# Patient Record
Sex: Male | Born: 1980 | Race: Black or African American | Hispanic: No | Marital: Single | Smoking: Current every day smoker
Health system: Southern US, Community
[De-identification: ages and names within clinical notes are randomized; demographics above are authoritative.]

---

## 2004-07-17 ENCOUNTER — Emergency Department: Payer: Self-pay | Admitting: Emergency Medicine

## 2007-02-11 ENCOUNTER — Emergency Department: Payer: Self-pay | Admitting: Emergency Medicine

## 2007-09-13 ENCOUNTER — Emergency Department: Payer: Self-pay | Admitting: Emergency Medicine

## 2013-02-17 ENCOUNTER — Emergency Department: Payer: Self-pay | Admitting: Emergency Medicine

## 2013-02-17 LAB — CBC WITH DIFFERENTIAL/PLATELET
Basophil #: 0.1 10*3/uL (ref 0.0–0.1)
Basophil %: 1.2 %
Eosinophil #: 0.4 10*3/uL (ref 0.0–0.7)
Eosinophil %: 5.3 %
HCT: 43.8 % (ref 40.0–52.0)
MCH: 31.5 pg (ref 26.0–34.0)
MCHC: 33.9 g/dL (ref 32.0–36.0)
Monocyte %: 14.9 %
Neutrophil #: 3.2 10*3/uL (ref 1.4–6.5)
RDW: 12.7 % (ref 11.5–14.5)

## 2013-02-17 LAB — URINALYSIS, COMPLETE
Bilirubin,UR: NEGATIVE
Blood: NEGATIVE
Glucose,UR: NEGATIVE mg/dL (ref 0–75)
Ketone: NEGATIVE
Nitrite: NEGATIVE
Ph: 5 (ref 4.5–8.0)
Specific Gravity: 1.027 (ref 1.003–1.030)
Squamous Epithelial: 2

## 2013-02-17 LAB — COMPREHENSIVE METABOLIC PANEL
BUN: 13 mg/dL (ref 7–18)
Bilirubin,Total: 0.4 mg/dL (ref 0.2–1.0)
Co2: 30 mmol/L (ref 21–32)
Creatinine: 1.1 mg/dL (ref 0.60–1.30)
EGFR (African American): >60
Glucose: 91 mg/dL (ref 65–99)
Osmolality: 275 (ref 275–301)
SGOT(AST): 13 U/L — ABNORMAL LOW (ref 15–37)
Sodium: 138 mmol/L (ref 136–145)
Total Protein: 8.4 g/dL — ABNORMAL HIGH (ref 6.4–8.2)

## 2017-03-17 ENCOUNTER — Emergency Department: Payer: Self-pay

## 2017-03-17 ENCOUNTER — Other Ambulatory Visit: Payer: Self-pay

## 2017-03-17 ENCOUNTER — Emergency Department
Admission: EM | Admit: 2017-03-17 | Discharge: 2017-03-17 | Disposition: A | Discharge status: Home / Self Care | Payer: Self-pay | Attending: Emergency Medicine | Admitting: Emergency Medicine

## 2017-03-17 DIAGNOSIS — R0789 Other chest pain: Secondary | ICD-10-CM | POA: Insufficient documentation

## 2017-03-17 DIAGNOSIS — H538 Other visual disturbances: Secondary | ICD-10-CM | POA: Insufficient documentation

## 2017-03-17 DIAGNOSIS — R634 Abnormal weight loss: Secondary | ICD-10-CM | POA: Insufficient documentation

## 2017-03-17 DIAGNOSIS — R002 Palpitations: Secondary | ICD-10-CM | POA: Insufficient documentation

## 2017-03-17 LAB — BASIC METABOLIC PANEL
ANION GAP: 8 (ref 5–15)
BUN: 18 mg/dL (ref 6–20)
CALCIUM: 9.2 mg/dL (ref 8.9–10.3)
CO2: 28 mmol/L (ref 22–32)
Chloride: 100 mmol/L — ABNORMAL LOW (ref 101–111)
Creatinine, Ser: 1.1 mg/dL (ref 0.61–1.24)
Glucose, Bld: 92 mg/dL (ref 65–99)
Potassium: 3.8 mmol/L (ref 3.5–5.1)
Sodium: 136 mmol/L (ref 135–145)

## 2017-03-17 LAB — CBC
HCT: 45 % (ref 40.0–52.0)
HEMOGLOBIN: 15 g/dL (ref 13.0–18.0)
MCH: 31.4 pg (ref 26.0–34.0)
MCHC: 33.4 g/dL (ref 32.0–36.0)
MCV: 94.2 fL (ref 80.0–100.0)
Platelets: 259 10*3/uL (ref 150–440)
RBC: 4.78 MIL/uL (ref 4.40–5.90)
RDW: 12.9 % (ref 11.5–14.5)
WBC: 8.5 10*3/uL (ref 3.8–10.6)

## 2017-03-17 LAB — GLUCOSE, CAPILLARY: GLUCOSE-CAPILLARY: 80 mg/dL (ref 65–99)

## 2017-03-17 LAB — TSH: TSH: 1.9 u[IU]/mL (ref 0.350–4.500)

## 2017-03-17 LAB — TROPONIN I

## 2017-03-17 NOTE — ED Triage Notes (Signed)
Pt reports over the past month he has lost a lot of weight despite having a good appetite, pt states that over the past week his eyesight has been blurred and reports feeling chest pain and palpitations. Pt reports that he does donate plasma 3 times a month for the past year

## 2017-03-17 NOTE — ED Provider Notes (Signed)
[facility] Emergency Department Provider Note   ____________________________________________   First MD Initiated Contact with Patient 03/17/17 2304     (approximate)  I have reviewed the triage vital signs and the nursing notes.   HISTORY  Chief Complaint Chest Pain; Palpitations; and Weight Loss    HPI Joe Foster is a 37 y.o. male who presents to the ED from home with multiple medical complaints.  Firstly, patient reports unintentional weight loss over the past month of probably 20 pounds.  States that he eats a lot but his job at a [employer] is demanding.  Also notes that he donates plasma several times a month.  Initially he was donating up to 3 times a week; since the weight loss he is donating twice weekly.  Also, patient tells me he had to walk to work 8 miles each way for 2 weeks last month.  Secondly, patient reports chest pain with palpitations over the past week.  Describes tightness like a band wrapping both sides of his chest which resolves if he bears down or performs a Valsalva-like maneuver.  Has felt brief palpitations at work where he describes his heart rate is beating fast, not irregular.  These episodes are not associated with diaphoresis, shortness of breath, nausea/vomiting or dizziness.  Thirdly, patient has noted bilateral blurry vision over the past 1-2 weeks.  He does not wear corrective lenses and there has been no blunt or penetrating trauma.  States he can blink a few times in the blurry vision resolves.  Also mentions he has had polyuria and polydipsia.  Denies recent fever, chills, abdominal pain, diarrhea, bloody stools.  Denies recent travel, trauma or hormone use.   Past medical history None  There are no active problems to display for this patient.   History reviewed. No pertinent surgical history.  Prior to Admission medications   Not on File    Allergies Patient has no known allergies.  No family history  on file.  Social History Social History   Tobacco Use  . Smoking status: Not on file  Substance Use Topics  . Alcohol use: Not on file  . Drug use: Not on file  + smoker   Review of Systems  Constitutional: No fever/chills. Eyes: No visual changes. ENT: No sore throat. Cardiovascular: Positive for chest tightness and palpitations. Respiratory: Denies shortness of breath. Gastrointestinal: Positive for unintentional weight loss. No abdominal pain.  No nausea, no vomiting.  No diarrhea.  No constipation. Genitourinary: Positive for polyuria and polydipsia.  Negative for dysuria. Musculoskeletal: Negative for back pain. Skin: Negative for rash. Neurological: Negative for headaches, focal weakness or numbness.   ____________________________________________   PHYSICAL EXAM:  VITAL SIGNS: ED Triage Vitals  Enc Vitals Group     BP 03/17/17 2023 119/76     Pulse Rate 03/17/17 2023 77     Resp 03/17/17 2023 16     Temp 03/17/17 2023 97.7 F (36.5 C)     Temp Source 03/17/17 2023 Oral     SpO2 03/17/17 2023 99 %     Weight 03/17/17 2024 158 lb (71.7 kg)     Height 03/17/17 2024 6\' 2"  (1.88 m)     Head Circumference --      Peak Flow --      Pain Score 03/17/17 2034 4     Pain Loc --      Pain Edu? --      Excl. in GC? --  Constitutional: Alert and oriented. Well appearing and in no acute distress. Eyes: Conjunctivae are normal. PERRL. EOMI. Head: Atraumatic. Nose: No congestion/rhinnorhea. Mouth/Throat: Mucous membranes are moist.  Oropharynx non-erythematous. Neck: No stridor.   Cardiovascular: Normal rate, regular rhythm. Grossly normal heart sounds.  Good peripheral circulation. Respiratory: Normal respiratory effort.  No retractions. Lungs CTAB. Gastrointestinal: Thin. Soft and nontender. No distention. No abdominal bruits. No CVA tenderness. Musculoskeletal: No lower extremity tenderness nor edema.  No joint effusions. Neurologic:  Normal speech and  language. No gross focal neurologic deficits are appreciated. No gait instability. Skin:  Skin is warm, dry and intact. No rash noted. Psychiatric: Mood and affect are normal. Speech and behavior are normal.  ____________________________________________   LABS (all labs ordered are listed, but only abnormal results are displayed)  Labs Reviewed  BASIC METABOLIC PANEL - Abnormal; Notable for the following components:      Result Value   Chloride 100 (*)    All other components within normal limits  CBC  TROPONIN I  GLUCOSE, CAPILLARY  TSH  HEMOGLOBIN A1C   ____________________________________________  EKG  ED ECG REPORT I, Rebeccah Ivins J, the attending physician, personally viewed and interpreted this ECG.   Date: 03/17/2017  EKG Time: 2028  Rate: 96  Rhythm: normal EKG, normal sinus rhythm  Axis: RAD  Intervals:none  ST&T Change: Nonspecific  ____________________________________________  RADIOLOGY  Dg Chest 2 View  Result Date: 03/17/2017 CLINICAL DATA:  Pt reports over the past month he has lost a lot of weight despite having a good appetite, pt states that over the past week his eyesight has been blurred and reports feeling chest pain and palpitations. EXAM: CHEST  2 VIEW COMPARISON:  None. FINDINGS: The heart size and mediastinal contours are within normal limits. Both lungs are clear. The visualized skeletal structures are unremarkable. IMPRESSION: No active cardiopulmonary disease. Electronically Signed   By: Norva Pavlov M.D.   On: 03/17/2017 21:42    ____________________________________________   PROCEDURES  Procedure(s) performed: None  Procedures  Critical Care performed: No  ____________________________________________   INITIAL IMPRESSION / ASSESSMENT AND PLAN / ED COURSE  As part of my medical decision making, I reviewed the following data within the electronic MEDICAL RECORD NUMBER Nursing notes reviewed and incorporated, Labs reviewed, EKG  interpreted, Radiograph reviewed  and Notes from prior ED visits.   37 year old male who presents with unintentional weight loss, blurry vision, chest tightness and palpitations. Differential diagnosis includes, but is not limited to, ACS, aortic dissection, pulmonary embolism, cardiac tamponade, pneumothorax, pneumonia, pericarditis, myocarditis, GI-related causes including esophagitis/gastritis, metabolic etiology including diabetes, infectious etiology and musculoskeletal chest wall pain.    Lab work and chest x-ray unremarkable.  Patient is resting comfortably voicing no complaints currently.  Long discussion with patient; will add TSH, hemoglobin A1c. Sounds like part of patient's unintentional weight loss may be secondary to the fact that he had to walk 16 miles round trip to work for several weeks last month.  Symptoms of blurry vision, polydipsia and polyuria concerning for diabetes.  Although glucose is unremarkable on serum chemistries, will add hemoglobin A1c.  Will also refer patient to ophthalmology for eye exam.  Strict return precautions given.  Patient verbalizes understanding agrees with plan of care.      ____________________________________________   FINAL CLINICAL IMPRESSION(S) / ED DIAGNOSES  Final diagnoses:  Heart palpitations  Atypical chest pain  Blurry vision, bilateral  Weight loss     ED Discharge Orders    None  Note:  This document was prepared using Dragon voice recognition software and may include unintentional dictation errors.    Irean HongSung, Bereket Gernert J, MD 03/18/17 [phone]

## 2017-03-17 NOTE — ED Notes (Signed)
Reviewed discharge instructions and follow-up care with patient. Patient verbalized understanding of all information reviewed. Patient stable, with no distress noted at this time.    

## 2017-03-17 NOTE — ED Notes (Signed)
ED Provider at bedside. 

## 2017-03-17 NOTE — Discharge Instructions (Signed)
1.  You will be notified of any positive results from your thyroid and diabetes testing. 2.  Eat several small meals and drink plenty of fluids daily. 3.  Do not donate plasma more than twice a week. 4.  Return to the ER for worsening symptoms, persistent breathing or other concerns.

## 2017-03-17 NOTE — ED Notes (Signed)
Patient reports chronic, intermittent headaches with visual changes for approx 4 months.   Patient c/o left chest pain radiating to upper abdomen described as pressure. Patient reports SOB, nausea, and dizziness/lightheadedness with onset of chest pain; all symptoms with the exception of the chest pain have since resolved.

## 2017-03-18 LAB — HEMOGLOBIN A1C
HEMOGLOBIN A1C: 4.6 % — AB (ref 4.8–5.6)
Mean Plasma Glucose: 85.32 mg/dL

## 2018-03-07 ENCOUNTER — Other Ambulatory Visit: Payer: Self-pay

## 2018-03-07 ENCOUNTER — Encounter: Payer: Self-pay | Admitting: *Deleted

## 2018-03-07 DIAGNOSIS — K529 Noninfective gastroenteritis and colitis, unspecified: Secondary | ICD-10-CM | POA: Insufficient documentation

## 2018-03-07 DIAGNOSIS — F17200 Nicotine dependence, unspecified, uncomplicated: Secondary | ICD-10-CM | POA: Insufficient documentation

## 2018-03-07 DIAGNOSIS — J069 Acute upper respiratory infection, unspecified: Secondary | ICD-10-CM | POA: Insufficient documentation

## 2018-03-07 NOTE — ED Triage Notes (Signed)
Vomiting and diarrhea for about 4-5 days. Also has had a cough with productive yellow sputum.

## 2018-03-08 ENCOUNTER — Emergency Department: Payer: Self-pay

## 2018-03-08 ENCOUNTER — Emergency Department
Admission: EM | Admit: 2018-03-08 | Discharge: 2018-03-08 | Disposition: A | Discharge status: Home / Self Care | Payer: Self-pay | Attending: Emergency Medicine | Admitting: Emergency Medicine

## 2018-03-08 DIAGNOSIS — J069 Acute upper respiratory infection, unspecified: Secondary | ICD-10-CM

## 2018-03-08 DIAGNOSIS — K529 Noninfective gastroenteritis and colitis, unspecified: Secondary | ICD-10-CM

## 2018-03-08 LAB — INFLUENZA PANEL BY PCR (TYPE A & B)
Influenza A By PCR: NEGATIVE
Influenza B By PCR: NEGATIVE

## 2018-03-08 LAB — CBC
HCT: 39.7 % (ref 39.0–52.0)
Hemoglobin: 13.1 g/dL (ref 13.0–17.0)
MCH: 30.8 pg (ref 26.0–34.0)
MCHC: 33 g/dL (ref 30.0–36.0)
MCV: 93.4 fL (ref 80.0–100.0)
PLATELETS: 363 10*3/uL (ref 150–400)
RBC: 4.25 MIL/uL (ref 4.22–5.81)
RDW: 11.9 % (ref 11.5–15.5)
WBC: 9.2 10*3/uL (ref 4.0–10.5)
nRBC: 0 % (ref 0.0–0.2)

## 2018-03-08 LAB — COMPREHENSIVE METABOLIC PANEL
ALBUMIN: 3.8 g/dL (ref 3.5–5.0)
ALK PHOS: 48 U/L (ref 38–126)
ALT: 32 U/L (ref 0–44)
ANION GAP: 8 (ref 5–15)
AST: 26 U/L (ref 15–41)
BILIRUBIN TOTAL: 0.5 mg/dL (ref 0.3–1.2)
BUN: 12 mg/dL (ref 6–20)
CALCIUM: 8.8 mg/dL — AB (ref 8.9–10.3)
CO2: 25 mmol/L (ref 22–32)
Chloride: 106 mmol/L (ref 98–111)
Creatinine, Ser: 1.02 mg/dL (ref 0.61–1.24)
GFR calc Af Amer: >60 mL/min (ref >60)
GLUCOSE: 99 mg/dL (ref 70–99)
POTASSIUM: 3.4 mmol/L — AB (ref 3.5–5.1)
Sodium: 139 mmol/L (ref 135–145)
TOTAL PROTEIN: 6.8 g/dL (ref 6.5–8.1)

## 2018-03-08 LAB — URINALYSIS, COMPLETE (UACMP) WITH MICROSCOPIC
Bilirubin Urine: NEGATIVE
GLUCOSE, UA: NEGATIVE mg/dL
HGB URINE DIPSTICK: NEGATIVE
Ketones, ur: 20 mg/dL — AB
NITRITE: NEGATIVE
PH: 6 (ref 5.0–8.0)
Protein, ur: 30 mg/dL — AB
Specific Gravity, Urine: 1.025 (ref 1.005–1.030)

## 2018-03-08 LAB — LIPASE, BLOOD: Lipase: 26 U/L (ref 11–51)

## 2018-03-08 MED ORDER — SODIUM CHLORIDE 0.9 % IV BOLUS
1000.0000 mL | Freq: Once | INTRAVENOUS | Status: AC
  Administered 2018-03-08: 1000 mL via INTRAVENOUS

## 2018-03-08 MED ORDER — BENZONATATE 100 MG PO CAPS: 100.0000 mg | ORAL_CAPSULE | Freq: Three times a day (TID) | ORAL | 0 refills | Status: AC | PRN

## 2018-03-08 MED ORDER — ONDANSETRON HCL 4 MG/2ML IJ SOLN
4.0000 mg | Freq: Once | INTRAMUSCULAR | Status: AC
  Administered 2018-03-08: 4 mg via INTRAVENOUS
  Filled 2018-03-08: qty 2

## 2018-03-08 MED ORDER — POTASSIUM CHLORIDE CRYS ER 20 MEQ PO TBCR
40.0000 meq | EXTENDED_RELEASE_TABLET | Freq: Once | ORAL | Status: AC
  Administered 2018-03-08: 40 meq via ORAL
  Filled 2018-03-08: qty 2

## 2018-03-08 MED ORDER — ONDANSETRON 4 MG PO TBDP: 4.0000 mg | ORAL_TABLET | Freq: Three times a day (TID) | ORAL | 0 refills | Status: AC | PRN

## 2018-03-08 NOTE — ED Notes (Signed)
Assessment: pt states 4-5 days of vomiting and diarrhea. Pt states he initially had a fever with dry cough. Pt states fever has abated, but he is not able to "keep anything down". Pt states last diarrhea yesterday am.

## 2018-03-08 NOTE — ED Notes (Signed)
Report to rachel, rn.  

## 2018-03-08 NOTE — ED Notes (Signed)
Peripheral IV discontinued. Catheter intact. No signs of infiltration or redness. Gauze applied to IV site.   Discharge instructions reviewed with patient. Questions fielded by this RN. Patient verbalizes understanding of instructions. Patient discharged home in stable condition per brown. No acute distress noted at time of discharge.    

## 2018-03-08 NOTE — ED Provider Notes (Signed)
[facility] Emergency Department Provider Note    First MD Initiated Contact with Patient 03/08/18 [phone]     (approximate)  I have reviewed the triage vital signs and the nursing notes.   HISTORY  Chief Complaint Emesis    HPI Joe Foster is a 37 y.o. male presents to the emergency department a 5-day history of productive cough with yellow sputum vomiting and diarrhea.  Patient admits to subjective fevers.  Patient afebrile on presentation temperature 98.2.  Patient did not receive a flu vaccine this year.   Past medical history No chronic medical conditions. There are no active problems to display for this patient.  Surgical history None  Prior to Admission medications   Not on File    Allergies Patient has no known allergies.  No family history on file.  Social History Social History   Tobacco Use  . Smoking status: Current Every Day Smoker  Substance Use Topics  . Alcohol use: Yes  . Drug use: Yes    Types: Marijuana    Review of Systems Constitutional: No fever/chills Eyes: No visual changes. ENT: No sore throat. Cardiovascular: Denies chest pain.  Positive for productive cough Respiratory: Denies shortness of breath. Gastrointestinal: No abdominal pain.  Positive for vomiting and diarrhea genitourinary: Negative for dysuria. Musculoskeletal: Negative for neck pain.  Negative for back pain. Integumentary: Negative for rash. Neurological: Negative for headaches, focal weakness or numbness.   ____________________________________________   PHYSICAL EXAM:  VITAL SIGNS: ED Triage Vitals  Enc Vitals Group     BP 03/07/18 2350 (!) 144/94     Pulse Rate 03/07/18 2350 88     Resp 03/07/18 2350 18     Temp 03/07/18 2350 98.2 F (36.8 C)     Temp Source 03/07/18 2350 Oral     SpO2 03/07/18 2350 100 %     Weight --      Height --      Head Circumference --      Peak Flow --      Pain Score 03/07/18 2353 5     Pain Loc  --      Pain Edu? --      Excl. in GC? --     Constitutional: Alert and oriented. Well appearing and in no acute distress. Eyes: Conjunctivae are normal. Mouth/Throat: Mucous membranes are moist.  Oropharynx non-erythematous. Neck: No stridor.   Cardiovascular: Normal rate, regular rhythm. Good peripheral circulation. Grossly normal heart sounds. Respiratory: Normal respiratory effort.  No retractions. Lungs CTAB. Gastrointestinal: Soft and nontender. No distention.  Musculoskeletal: No lower extremity tenderness nor edema. No gross deformities of extremities. Neurologic:  Normal speech and language. No gross focal neurologic deficits are appreciated.  Skin:  Skin is warm, dry and intact. No rash noted. Psychiatric: Mood and affect are normal. Speech and behavior are normal. ____________________________________________   LABS (all labs ordered are listed, but only abnormal results are displayed)  Labs Reviewed  COMPREHENSIVE METABOLIC PANEL - Abnormal; Notable for the following components:      Result Value   Potassium 3.4 (*)    Calcium 8.8 (*)    All other components within normal limits  URINALYSIS, COMPLETE (UACMP) WITH MICROSCOPIC - Abnormal; Notable for the following components:   Color, Urine YELLOW (*)    APPearance CLEAR (*)    Ketones, ur 20 (*)    Protein, ur 30 (*)    Leukocytes, UA TRACE (*)    Bacteria, UA RARE (*)  All other components within normal limits  GASTROINTESTINAL PANEL BY PCR, STOOL (REPLACES STOOL CULTURE)  LIPASE, BLOOD  CBC  INFLUENZA PANEL BY PCR (TYPE A & B)   _________________________________  RADIOLOGY I, [place] Kenedi Cilia, personally viewed and evaluated these images (plain radiographs) as part of my medical decision making, as well as reviewing the written report by the radiologist.  ED MD interpretation: No active cardiopulmonary disease on chest x-ray per radiologist  Official radiology report(s): Dg Chest 2 View  Result Date:  03/08/2018 CLINICAL DATA:  Vomiting and diarrhea for 5 days. EXAM: CHEST - 2 VIEW COMPARISON:  March 17, 2017 FINDINGS: The heart size and mediastinal contours are within normal limits. Both lungs are clear. The visualized skeletal structures are unremarkable. IMPRESSION: No active cardiopulmonary disease. Electronically Signed   By: Sherian ReinWei-Chen  Lin M.D.   On: 03/08/2018 02:22    Procedures   ____________________________________________   INITIAL IMPRESSION / ASSESSMENT AND PLAN / ED COURSE  As part of my medical decision making, I reviewed the following data within the electronic MEDICAL RECORD NUMBER   37 year old male presenting with above-stated history and physical exam secondary to vomiting diarrhea and productive cough.  Considered possibly of pneumonia and as such chest x-ray was performed which was negative also considered possibility of influenza which was also negative.  Regarding the patient's vomiting and diarrhea consider the possibility of infectious diarrhea and as such stool sample ordered however patient had no further bowel movements while in the emergency department.  Patient given 2 L IV normal saline potassium 40 mEq.  I suspect viral etiology for the patient's symptoms and as such patient prescribed Tessalon Perles and Zofran ODT for home with recommendation oral hydration to continue at home ____________________________________________  FINAL CLINICAL IMPRESSION(S) / ED DIAGNOSES  Final diagnoses:  Gastroenteritis  Upper respiratory tract infection, unspecified type     MEDICATIONS GIVEN DURING THIS VISIT:  Medications  potassium chloride SA (K-DUR,KLOR-CON) CR tablet 40 mEq (has no administration in time range)  sodium chloride 0.9 % bolus 1,000 mL (1,000 mLs Intravenous New Bag/Given 03/08/18 0219)  sodium chloride 0.9 % bolus 1,000 mL (1,000 mLs Intravenous New Bag/Given 03/08/18 0219)  ondansetron (ZOFRAN) injection 4 mg (4 mg Intravenous Given 03/08/18 0310)      ED Discharge Orders    None       Note:  This document was prepared using Dragon voice recognition software and may include unintentional dictation errors.    Darci CurrentBrown, [place] N, MD 03/08/18 2220

## 2018-04-28 ENCOUNTER — Other Ambulatory Visit: Payer: Self-pay

## 2018-04-28 ENCOUNTER — Emergency Department
Admission: EM | Admit: 2018-04-28 | Discharge: 2018-04-28 | Disposition: A | Discharge status: Home / Self Care | Payer: Self-pay | Attending: Emergency Medicine | Admitting: Emergency Medicine

## 2018-04-28 ENCOUNTER — Encounter: Payer: Self-pay | Admitting: Emergency Medicine

## 2018-04-28 ENCOUNTER — Emergency Department: Payer: Self-pay

## 2018-04-28 DIAGNOSIS — S39012A Strain of muscle, fascia and tendon of lower back, initial encounter: Secondary | ICD-10-CM | POA: Insufficient documentation

## 2018-04-28 DIAGNOSIS — Y9389 Activity, other specified: Secondary | ICD-10-CM | POA: Insufficient documentation

## 2018-04-28 DIAGNOSIS — F17200 Nicotine dependence, unspecified, uncomplicated: Secondary | ICD-10-CM | POA: Insufficient documentation

## 2018-04-28 DIAGNOSIS — X500XXA Overexertion from strenuous movement or load, initial encounter: Secondary | ICD-10-CM | POA: Insufficient documentation

## 2018-04-28 DIAGNOSIS — Y9289 Other specified places as the place of occurrence of the external cause: Secondary | ICD-10-CM | POA: Insufficient documentation

## 2018-04-28 DIAGNOSIS — Y990 Civilian activity done for income or pay: Secondary | ICD-10-CM | POA: Insufficient documentation

## 2018-04-28 MED ORDER — CYCLOBENZAPRINE HCL 10 MG PO TABS: 10.0000 mg | ORAL_TABLET | Freq: Three times a day (TID) | ORAL | 0 refills | Status: AC | PRN

## 2018-04-28 MED ORDER — ORPHENADRINE CITRATE 30 MG/ML IJ SOLN
60.0000 mg | Freq: Two times a day (BID) | INTRAMUSCULAR | Status: DC
  Administered 2018-04-28: 60 mg via INTRAMUSCULAR
  Filled 2018-04-28: qty 2

## 2018-04-28 MED ORDER — LIDOCAINE 5 % EX PTCH: 1.0000 | MEDICATED_PATCH | Freq: Two times a day (BID) | CUTANEOUS | 0 refills | Status: AC

## 2018-04-28 MED ORDER — LIDOCAINE 5 % EX PTCH
1.0000 | MEDICATED_PATCH | CUTANEOUS | Status: DC
  Administered 2018-04-28: 1 via TRANSDERMAL
  Filled 2018-04-28: qty 1

## 2018-04-28 MED ORDER — HYDROMORPHONE HCL 1 MG/ML IJ SOLN
1.0000 mg | Freq: Once | INTRAMUSCULAR | Status: AC
  Administered 2018-04-28: 1 mg via INTRAMUSCULAR
  Filled 2018-04-28: qty 1

## 2018-04-28 MED ORDER — OXYCODONE-ACETAMINOPHEN 7.5-325 MG PO TABS: 1.0000 | ORAL_TABLET | Freq: Four times a day (QID) | ORAL | 0 refills | Status: AC | PRN

## 2018-04-28 MED ORDER — KETOROLAC TROMETHAMINE 60 MG/2ML IM SOLN
60.0000 mg | Freq: Once | INTRAMUSCULAR | Status: AC
  Administered 2018-04-28: 60 mg via INTRAMUSCULAR
  Filled 2018-04-28: qty 2

## 2018-04-28 NOTE — ED Provider Notes (Signed)
[facility] Emergency Department Provider Note   ____________________________________________   First MD Initiated Contact with Patient 04/28/18 1020     (approximate)  I have reviewed the triage vital signs and the nursing notes.   HISTORY  Chief Complaint Back Pain    HPI Joe Foster is a 38 y.o. male patient complain low back pain for 1 week.  Onset of complaint was after heavy lifting incident at work.  Patient state reporting situation ploidy status of muscle strain.  Patient continued to work and the pain worsened.  Patient denies radicular component to back pain.  Patient denies bladder bowel dysfunction.  Patient state pain increased with changing of positions i.e. sitting to standing.  Patient state he feels spasm on the lateral sides of his back.  No palliative measures for complaint.  Rates pain as a 8/10.    History reviewed. No pertinent past medical history.  There are no active problems to display for this patient.   History reviewed. No pertinent surgical history.  Prior to Admission medications   Medication Sig Start Date End Date Taking? Authorizing Provider  benzonatate (TESSALON PERLES) 100 MG capsule Take 1 capsule (100 mg total) by mouth 3 (three) times daily as needed for cough. 03/08/18   Darci Current, MD  cyclobenzaprine (FLEXERIL) 10 MG tablet Take 1 tablet (10 mg total) by mouth 3 (three) times daily as needed. 04/28/18   Joni Reining, PA-C  lidocaine (LIDODERM) 5 % Place 1 patch onto the skin every 12 (twelve) hours. Remove & Discard patch within 12 hours or as directed by MD 04/28/18 04/28/19  Joni Reining, PA-C  ondansetron (ZOFRAN ODT) 4 MG disintegrating tablet Take 1 tablet (4 mg total) by mouth every 8 (eight) hours as needed. 03/08/18   Darci Current, MD  oxyCODONE-acetaminophen (PERCOCET) 7.5-325 MG tablet Take 1 tablet by mouth every 6 (six) hours as needed. 04/28/18   Joni Reining, PA-C     Allergies Patient has no known allergies.  No family history on file.  Social History Social History   Tobacco Use  . Smoking status: Current Every Day Smoker  . Smokeless tobacco: Current User    Types: Snuff  Substance Use Topics  . Alcohol use: Yes  . Drug use: Yes    Types: Marijuana    Review of Systems Constitutional: No fever/chills Eyes: No visual changes. ENT: No sore throat. Cardiovascular: Denies chest pain. Respiratory: Denies shortness of breath. Gastrointestinal: No abdominal pain.  No nausea, no vomiting.  No diarrhea.  No constipation. Genitourinary: Negative for dysuria. Musculoskeletal: Positive for back pain. Skin: Negative for rash. Neurological: Negative for headaches, focal weakness or numbness.   ____________________________________________   PHYSICAL EXAM:  VITAL SIGNS: ED Triage Vitals  Enc Vitals Group     BP 04/28/18 0922 125/82     Pulse Rate 04/28/18 0922 80     Resp 04/28/18 0922 18     Temp 04/28/18 0922 98.4 F (36.9 C)     Temp Source 04/28/18 0922 Oral     SpO2 04/28/18 0922 100 %     Weight 04/28/18 0923 180 lb (81.6 kg)     Height 04/28/18 0923 6\' 2"  (1.88 m)     Head Circumference --      Peak Flow --      Pain Score 04/28/18 0923 8     Pain Loc --      Pain Edu? --  Excl. in GC? --    Constitutional: Alert and oriented.  Moderate distress.   Neck: No stridor.  Hematological/Lymphatic/Immunilogical: No cervical lymphadenopathy. Cardiovascular: Normal rate, regular rhythm. Grossly normal heart sounds.  Good peripheral circulation. Respiratory: Normal respiratory effort.  No retractions. Lungs CTAB. Musculoskeletal: No obvious deformity to the lumbar spine.  Patient is moderate guarding palpation of L3-S1.  Patient decreased range of motion with lateral flexion movements.   Neurologic:  Normal speech and language. No gross focal neurologic deficits are appreciated. No gait instability. Skin:  Skin is warm, dry  and intact. No rash noted. Psychiatric: Mood and affect are normal. Speech and behavior are normal.  ____________________________________________   LABS (all labs ordered are listed, but only abnormal results are displayed)  Labs Reviewed - No data to display ____________________________________________  EKG   ____________________________________________  RADIOLOGY  ED MD interpretation:    Official radiology report(s): Dg Lumbar Spine 2-3 Views  Result Date: 04/28/2018 CLINICAL DATA:  Low back pain.  Heard back at work 1 week ago. EXAM: LUMBAR SPINE - 2-3 VIEW COMPARISON:  Chest radiographs 03/08/2018. FINDINGS: Prior radiographs demonstrate 13 pairs of ribs. Accordingly, the last pair of ribs is assigned to the L1 level. The alignment is normal. The disc spaces are preserved. No evidence of acute fracture or pars defect. The sacroiliac joints appear normal. IMPRESSION: Transitional lumbosacral anatomy. No evidence of acute lumbar spine injury. Electronically Signed   By: Carey Bullocks M.D.   On: 04/28/2018 12:15    ____________________________________________   PROCEDURES  Procedure(s) performed: None  Procedures  Critical Care performed: No  ____________________________________________   INITIAL IMPRESSION / ASSESSMENT AND PLAN / ED COURSE  As part of my medical decision making, I reviewed the following data within the electronic MEDICAL RECORD NUMBER     Patient presents with low back pain secondary to  Strain.  Discussed negative lumbar spine x-ray findings with patient.  Patient given discharge care instruction advised take medication as directed.  Patient advised follow-up Worker's Comp. doctor if condition persist.      ____________________________________________   FINAL CLINICAL IMPRESSION(S) / ED DIAGNOSES  Final diagnoses:  Strain of lumbar region, initial encounter     ED Discharge Orders         Ordered    oxyCODONE-acetaminophen (PERCOCET)  7.5-325 MG tablet  Every 6 hours PRN     04/28/18 1231    cyclobenzaprine (FLEXERIL) 10 MG tablet  3 times daily PRN     04/28/18 1231    lidocaine (LIDODERM) 5 %  Every 12 hours     04/28/18 1231           Note:  This document was prepared using Dragon voice recognition software and may include unintentional dictation errors.    Joni Reining, PA-C 04/28/18 1235    Arnaldo Natal, MD 04/28/18 [phone]

## 2018-04-28 NOTE — ED Triage Notes (Signed)
W/C    Sent by employer.   Pt complaining of hurting his back at work approx x1 week ago.  Lower back pain , stiff to get up from sitting position.

## 2018-04-28 NOTE — ED Notes (Signed)
See triage note  States he hurt his back about 1 week ago  Was told that he may have strained his back  Was not seen at the time  States treating pain with ice/heat and OTC meds  States pain is across lower back ambulates well to treatment room

## 2018-04-28 NOTE — Discharge Instructions (Addendum)
Follow discharge care instructions.  Do not take narcotics and muscle relaxers while at work or driving.  Follow-up with companyWorker's Compensation doctor if condition persist.

## 2019-08-10 IMAGING — CR DG LUMBAR SPINE 2-3V
3 series · 3 of 3 positions shown · non-contrast
Comparison: Chest radiographs 03/08/2018.

CLINICAL DATA: Low back pain.  Heard back at work 1 week ago.

EXAM:
LUMBAR SPINE - 2-3 VIEW

[l-spine ap]
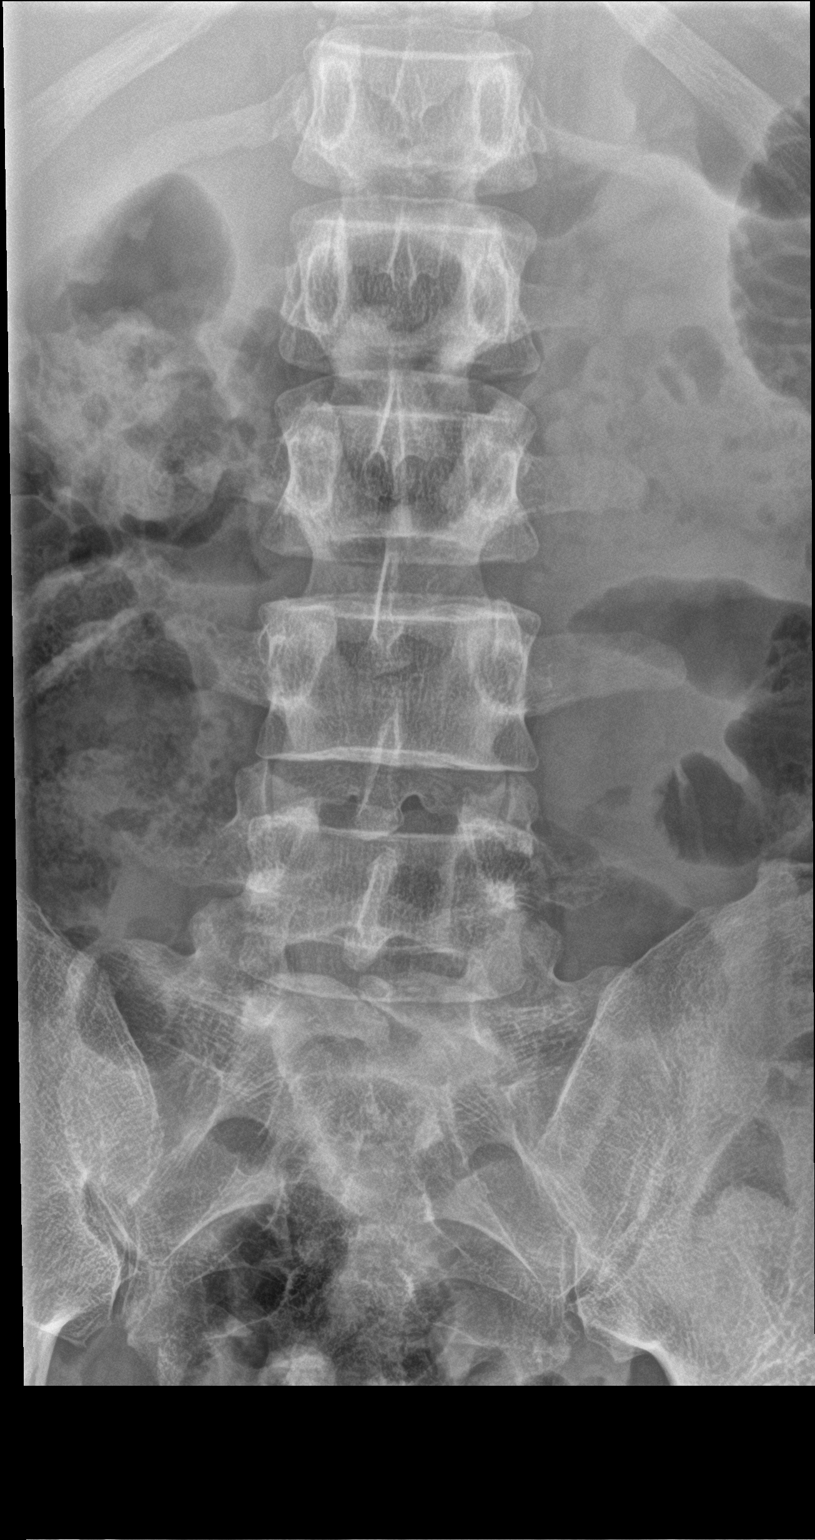

[l-spine lat]
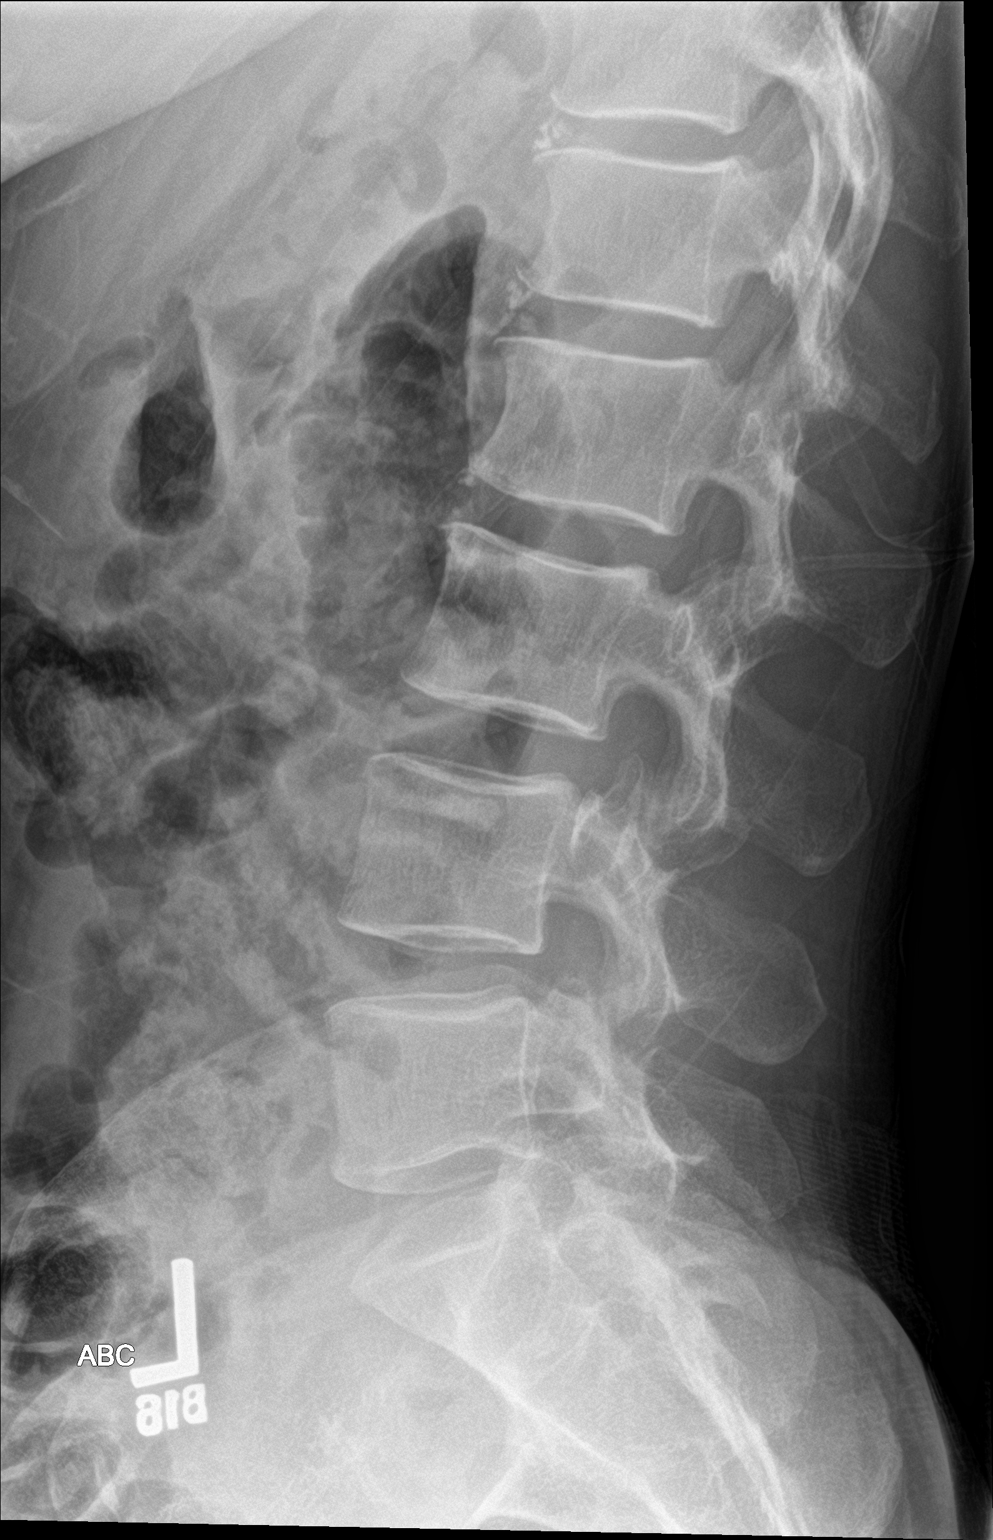

[l-spine spot]
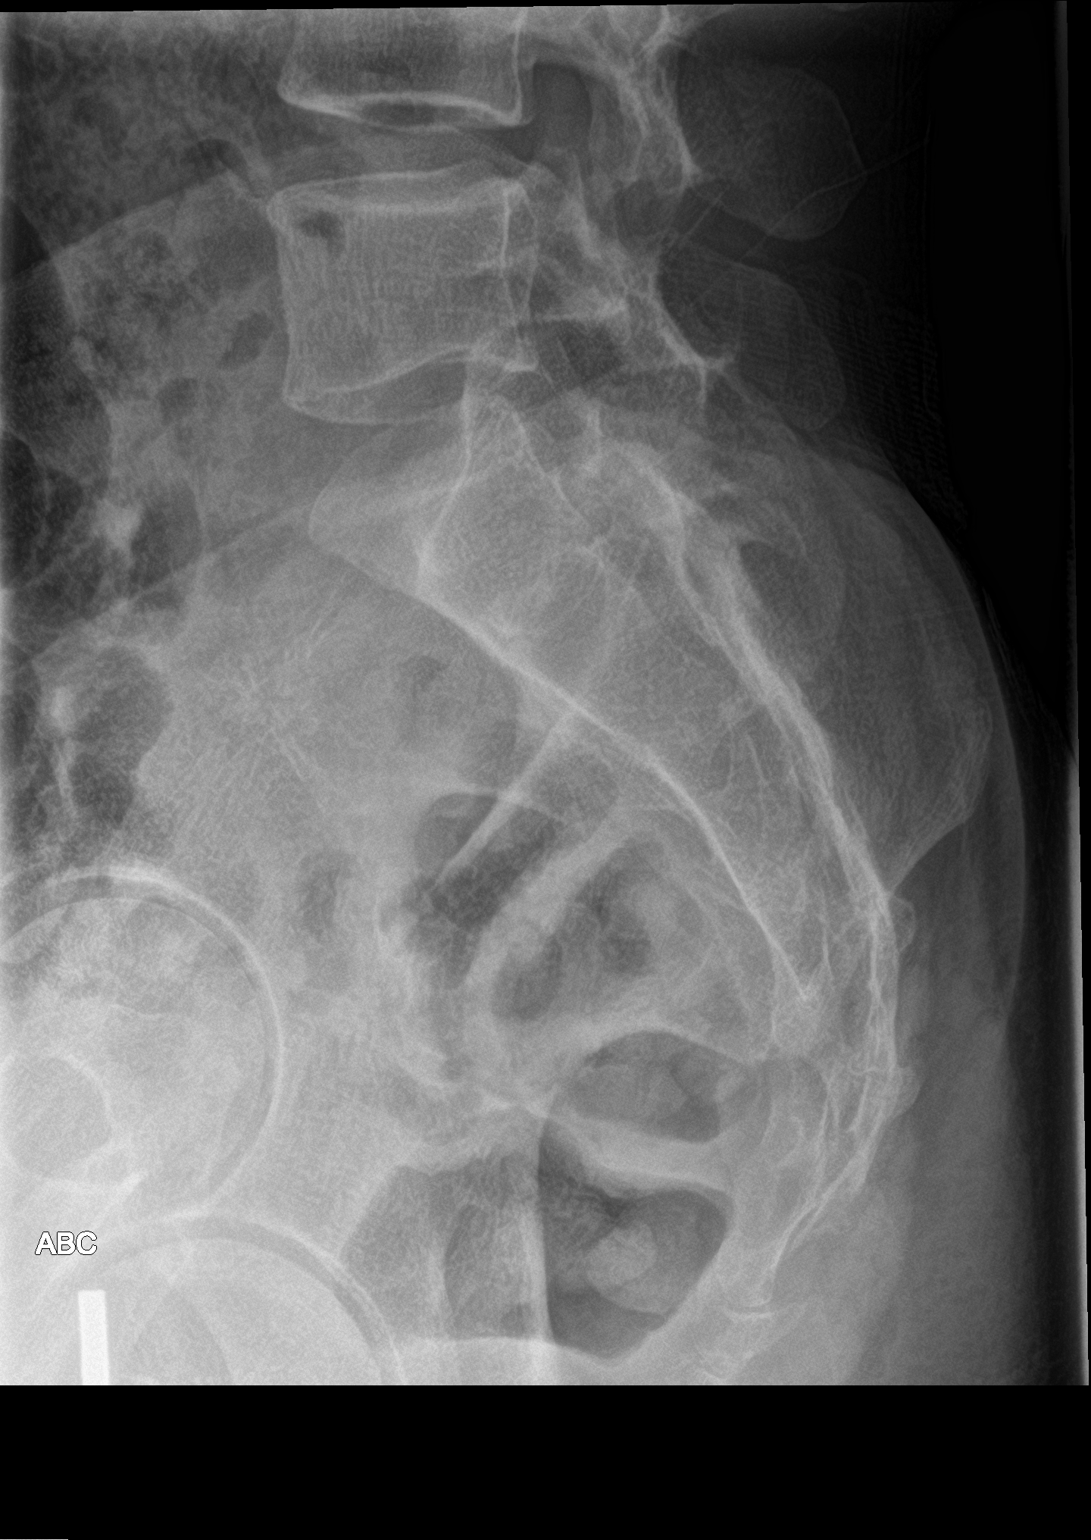

[3 of 3 positions shown; findings below may reference images not displayed]

FINDINGS: Prior radiographs demonstrate 13 pairs of ribs. Accordingly, the
last pair of ribs is assigned to the L1 level. The alignment is
normal. The disc spaces are preserved. No evidence of acute fracture
or pars defect. The sacroiliac joints appear normal.
IMPRESSION: Transitional lumbosacral anatomy. No evidence of acute lumbar spine
injury.

## 2019-11-23 ENCOUNTER — Ambulatory Visit: Payer: Self-pay
